# Patient Record
Sex: Male | Born: 1970 | Race: White | Hispanic: No | Smoking: Current every day smoker
Health system: Southern US, Community
[De-identification: ages and names within clinical notes are randomized; demographics above are authoritative.]

## PROBLEM LIST (undated history)

## (undated) DIAGNOSIS — M1990 Unspecified osteoarthritis, unspecified site: Secondary | ICD-10-CM

## (undated) HISTORY — PX: TONSILLECTOMY: SUR1361

## (undated) HISTORY — PX: NECK SURGERY: SHX720

---

## 2004-02-07 ENCOUNTER — Emergency Department (HOSPITAL_COMMUNITY): Admission: EM | Admit: 2004-02-07 | Discharge: 2004-02-07 | Payer: Self-pay | Admitting: Emergency Medicine

## 2004-07-20 ENCOUNTER — Ambulatory Visit: Payer: Self-pay | Admitting: Family Medicine

## 2004-08-06 ENCOUNTER — Ambulatory Visit: Payer: Self-pay | Admitting: Family Medicine

## 2004-11-11 ENCOUNTER — Ambulatory Visit: Payer: Self-pay | Admitting: Family Medicine

## 2005-02-22 ENCOUNTER — Ambulatory Visit: Payer: Self-pay | Admitting: Family Medicine

## 2013-03-07 ENCOUNTER — Encounter (HOSPITAL_COMMUNITY): Payer: Self-pay | Admitting: Emergency Medicine

## 2013-03-07 ENCOUNTER — Emergency Department (HOSPITAL_COMMUNITY)
Admission: EM | Admit: 2013-03-07 | Discharge: 2013-03-07 | Disposition: A | Discharge status: Home / Self Care | Payer: No Typology Code available for payment source | Attending: Emergency Medicine | Admitting: Emergency Medicine

## 2013-03-07 DIAGNOSIS — R202 Paresthesia of skin: Secondary | ICD-10-CM

## 2013-03-07 DIAGNOSIS — F17200 Nicotine dependence, unspecified, uncomplicated: Secondary | ICD-10-CM | POA: Insufficient documentation

## 2013-03-07 DIAGNOSIS — Z79899 Other long term (current) drug therapy: Secondary | ICD-10-CM | POA: Insufficient documentation

## 2013-03-07 DIAGNOSIS — IMO0002 Reserved for concepts with insufficient information to code with codable children: Secondary | ICD-10-CM | POA: Insufficient documentation

## 2013-03-07 DIAGNOSIS — R5381 Other malaise: Secondary | ICD-10-CM | POA: Insufficient documentation

## 2013-03-07 DIAGNOSIS — M79602 Pain in left arm: Secondary | ICD-10-CM

## 2013-03-07 DIAGNOSIS — M79609 Pain in unspecified limb: Secondary | ICD-10-CM | POA: Insufficient documentation

## 2013-03-07 DIAGNOSIS — R209 Unspecified disturbances of skin sensation: Secondary | ICD-10-CM | POA: Insufficient documentation

## 2013-03-07 LAB — POCT I-STAT TROPONIN I: Troponin i, poc: 0 ng/mL (ref 0.00–0.08)

## 2013-03-07 MED ORDER — CYCLOBENZAPRINE HCL 10 MG PO TABS
5.0000 mg | ORAL_TABLET | Freq: Once | ORAL | Status: DC
  Filled 2013-03-07: qty 1

## 2013-03-07 MED ORDER — PREDNISONE 20 MG PO TABS: ORAL_TABLET | ORAL | Status: DC

## 2013-03-07 MED ORDER — METHOCARBAMOL 500 MG PO TABS: 500.0000 mg | ORAL_TABLET | Freq: Two times a day (BID) | ORAL | Status: DC | PRN

## 2013-03-07 MED ORDER — MORPHINE SULFATE 4 MG/ML IJ SOLN
6.0000 mg | Freq: Once | INTRAMUSCULAR | Status: AC
  Administered 2013-03-07: 6 mg via INTRAMUSCULAR
  Filled 2013-03-07: qty 2

## 2013-03-07 MED ORDER — CYCLOBENZAPRINE HCL 10 MG PO TABS: 10.0000 mg | ORAL_TABLET | Freq: Two times a day (BID) | ORAL | Status: DC | PRN
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
## 2013-03-07 NOTE — ED Notes (Signed)
Presents with left arm numbness from the shoulder down into the armpit and down to the first three fingers, began Monday, denies injury. Numbness is associated with pain described as "it feels like a turniquet is wrapped around it" assocaited with feeling tired and "stuffy headed" Speech clear, no droop or arm drift, dneis dizziness. Pt alert, oreinted,. denies chest pain and SOB.

## 2013-03-07 NOTE — ED Provider Notes (Signed)
CSN: [id number]     Arrival date & time 03/07/13  1531 History   First MD Initiated Contact with Patient 03/07/13 1637     Chief Complaint  Patient presents with  . Arm Pain   (Consider location/radiation/quality/duration/timing/severity/associated sxs/prior Treatment) HPI Comments: Pt is a 42 y.o. male with Pmhx as above who presents with 3 days of L arm pain from shoulder to L hand and paresthesias of pointer, middle, ring finger.  He also reports weakness of hand.  No other neuro complains, no injuries, no CP, SOB, fever.  Patient is a 42 y.o. male presenting with extremity pain. The history is provided by the patient and the spouse.  Extremity Pain This is a new problem. The current episode started more than 2 days ago. The problem occurs constantly. The problem has been gradually worsening. Pertinent negatives include no chest pain, no abdominal pain, no headaches and no shortness of breath. Exacerbated by: movement of extermity. The symptoms are relieved by rest. Treatments tried: norc. The treatment provided moderate relief.    History reviewed. No pertinent past medical history. History reviewed. No pertinent past surgical history. History reviewed. No pertinent family history. History  Substance Use Topics  . Smoking status: Current Every Day Smoker    Types: Cigarettes  . Smokeless tobacco: Not on file  . Alcohol Use: No    Review of Systems  Constitutional: Negative for fever, activity change, appetite change and fatigue.  HENT: Negative for congestion, facial swelling, rhinorrhea and trouble swallowing.   Eyes: Negative for photophobia and pain.  Respiratory: Negative for cough, chest tightness and shortness of breath.   Cardiovascular: Negative for chest pain and leg swelling.  Gastrointestinal: Negative for nausea, vomiting, abdominal pain, diarrhea and constipation.  Endocrine: Negative for polydipsia and polyuria.  Genitourinary: Negative for dysuria, urgency,  decreased urine volume and difficulty urinating.  Musculoskeletal: Negative for back pain and gait problem.  Skin: Negative for color change, rash and wound.  Allergic/Immunologic: Negative for immunocompromised state.  Neurological: Positive for weakness and numbness. Negative for dizziness, facial asymmetry, speech difficulty and headaches.  Psychiatric/Behavioral: Negative for confusion, decreased concentration and agitation.    Allergies  Review of patient's allergies indicates no known allergies.  Home Medications   Current Outpatient Rx  Name  Route  Sig  Dispense  Refill  . docusate sodium (COLACE) 100 MG capsule   Oral   Take 100 mg by mouth every morning.         Marland Kitchen FLUoxetine (PROZAC) 10 MG capsule   Oral   Take 10 mg by mouth daily.         Marland Kitchen HYDROcodone-acetaminophen (NORCO) 10-325 MG per tablet   Oral   Take 1-2 tablets by mouth every 6 (six) hours as needed for moderate pain.         Marland Kitchen loratadine (CLARITIN) 10 MG tablet   Oral   Take 10 mg by mouth daily.         . Multiple Vitamin (MULTIVITAMIN WITH MINERALS) TABS tablet   Oral   Take 1 tablet by mouth daily.         . cyclobenzaprine (FLEXERIL) 10 MG tablet   Oral   Take 1 tablet (10 mg total) by mouth 2 (two) times daily as needed for muscle spasms.   20 tablet   0   . predniSONE (DELTASONE) 20 MG tablet      3 tabs po day one, then 2 tabs daily x 4 days  11 tablet   0    BP 119/66  Pulse 86  Temp(Src) 97.8 F (36.6 C) (Oral)  Resp 16  SpO2 96% Physical Exam  Constitutional: He is oriented to person, place, and time. He appears well-developed and well-nourished. No distress.  HENT:  Head: Normocephalic and atraumatic.  Mouth/Throat: No oropharyngeal exudate.  Eyes: Pupils are equal, round, and reactive to light.  Neck: Normal range of motion. Neck supple.  Cardiovascular: Normal rate, regular rhythm and normal heart sounds.  Exam reveals no gallop and no friction rub.   No  murmur heard. Pulmonary/Chest: Effort normal and breath sounds normal. No respiratory distress. He has no wheezes. He has no rales.  Abdominal: Soft. Bowel sounds are normal. He exhibits no distension and no mass. There is no tenderness. There is no rebound and no guarding.  Musculoskeletal: Normal range of motion. He exhibits no edema.       Left shoulder: He exhibits normal range of motion and no tenderness.       Left elbow: Tenderness found.       Arms: Muscular tenderness of L shoulder/arm  Neurological: He is alert and oriented to person, place, and time. He has normal strength. He displays no tremor. A sensory deficit is present. No cranial nerve deficit. He exhibits normal muscle tone. Coordination normal. GCS eye subscore is 4. GCS verbal subscore is 5. GCS motor subscore is 6.  Dec fine touch of pointer, middle, ring finger L hand  Skin: Skin is warm and dry.  Psychiatric: He has a normal mood and affect.    ED Course  Procedures (including critical care time) Labs Review Labs Reviewed  POCT I-STAT TROPONIN I   Imaging Review No results found.  EKG Interpretation    Date/Time:  Wednesday March 07 2013 15:39:52 EST Ventricular Rate:  77 PR Interval:  142 QRS Duration: 100 QT Interval:  384 QTC Calculation: 434 R Axis:   64 Text Interpretation:  Normal sinus rhythm Normal ECG No prior available Confirmed by Mishael Haran  MD, Reneta Niehaus ([id number]) on 03/07/2013 5:32:57 PM            MDM   1. Left arm pain   2. Paresthesias in left hand    Pt is a 42 y.o. male with Pmhx as above who presents with 3 days of L arm pain from shoulder to L hand and paresthesias of pointer, middle, ring finger.  He also reports weakness of hand.  No other neuro complains, no injuries, no CP, SOB, fever.  On PE, VSS, pt in NAD.  No discernable weakness on neuro exam.  +ttp of musculature of L arm, particularly of trapezius, deltoid & bicep.  Good distal pulses/perfusion.  Dec subjective fine  touch of above fingers.  No midline neck pain.  Doubt central process such as CVA/TIA, and doubt acute central cord compression.  I feel a cervical radiculopathy most likely cause of symptoms. Pt has good outpt f/u and will speak w/ his PCP tomorrow to consider outpt MRI.  He can continue home narcotics, will add 5 day course of steroids and muscle relaxer.  Return precautions given for new or worsening symptoms including worsening pain, weakness, other focal neuro symptoms.            Shanna Cisco, MD 03/08/13 1154

## 2013-10-30 ENCOUNTER — Encounter (HOSPITAL_COMMUNITY): Payer: Self-pay | Admitting: Emergency Medicine

## 2013-10-30 ENCOUNTER — Emergency Department (HOSPITAL_COMMUNITY)
Admission: EM | Admit: 2013-10-30 | Discharge: 2013-10-30 | Disposition: A | Discharge status: Home / Self Care | Payer: Commercial Indemnity | Attending: Emergency Medicine | Admitting: Emergency Medicine

## 2013-10-30 DIAGNOSIS — M542 Cervicalgia: Secondary | ICD-10-CM | POA: Insufficient documentation

## 2013-10-30 DIAGNOSIS — F17200 Nicotine dependence, unspecified, uncomplicated: Secondary | ICD-10-CM | POA: Insufficient documentation

## 2013-10-30 DIAGNOSIS — G8911 Acute pain due to trauma: Secondary | ICD-10-CM | POA: Insufficient documentation

## 2013-10-30 DIAGNOSIS — IMO0002 Reserved for concepts with insufficient information to code with codable children: Secondary | ICD-10-CM | POA: Insufficient documentation

## 2013-10-30 DIAGNOSIS — G8929 Other chronic pain: Secondary | ICD-10-CM

## 2013-10-30 DIAGNOSIS — Z87828 Personal history of other (healed) physical injury and trauma: Secondary | ICD-10-CM | POA: Insufficient documentation

## 2013-10-30 DIAGNOSIS — Z8739 Personal history of other diseases of the musculoskeletal system and connective tissue: Secondary | ICD-10-CM | POA: Insufficient documentation

## 2013-10-30 DIAGNOSIS — M25569 Pain in unspecified knee: Secondary | ICD-10-CM | POA: Insufficient documentation

## 2013-10-30 DIAGNOSIS — Z79899 Other long term (current) drug therapy: Secondary | ICD-10-CM | POA: Insufficient documentation

## 2013-10-30 HISTORY — DX: Unspecified osteoarthritis, unspecified site: M19.90

## 2013-10-30 MED ORDER — HYDROMORPHONE HCL PF 1 MG/ML IJ SOLN
1.0000 mg | Freq: Once | INTRAMUSCULAR | Status: AC
  Administered 2013-10-30: 1 mg via INTRAMUSCULAR
  Filled 2013-10-30: qty 1
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
## 2013-10-31 NOTE — ED Provider Notes (Signed)
Medical screening examination/treatment/procedure(s) were performed by non-physician practitioner and as supervising physician I was immediately available for consultation/collaboration.   EKG Interpretation None       David HornJohn M Jovontae Banko, MD 10/31/13 0110

## 2016-10-16 ENCOUNTER — Emergency Department (HOSPITAL_COMMUNITY): Payer: No Typology Code available for payment source

## 2016-10-16 ENCOUNTER — Encounter (HOSPITAL_COMMUNITY): Payer: Self-pay | Admitting: *Deleted

## 2016-10-16 ENCOUNTER — Emergency Department (HOSPITAL_COMMUNITY)
Admission: EM | Admit: 2016-10-16 | Discharge: 2016-10-16 | Disposition: A | Discharge status: Home / Self Care | Payer: No Typology Code available for payment source | Attending: Emergency Medicine | Admitting: Emergency Medicine

## 2016-10-16 DIAGNOSIS — R0789 Other chest pain: Secondary | ICD-10-CM | POA: Insufficient documentation

## 2016-10-16 DIAGNOSIS — F17210 Nicotine dependence, cigarettes, uncomplicated: Secondary | ICD-10-CM | POA: Insufficient documentation

## 2016-10-16 LAB — CBC
HCT: 43.9 % (ref 39.0–52.0)
Hemoglobin: 14.7 g/dL (ref 13.0–17.0)
MCH: 28.6 pg (ref 26.0–34.0)
MCHC: 33.5 g/dL (ref 30.0–36.0)
MCV: 85.4 fL (ref 78.0–100.0)
Platelets: 237 10*3/uL (ref 150–400)
RBC: 5.14 MIL/uL (ref 4.22–5.81)
RDW: 13.3 % (ref 11.5–15.5)
WBC: 7.1 10*3/uL (ref 4.0–10.5)

## 2016-10-16 LAB — BASIC METABOLIC PANEL
Anion gap: 9 (ref 5–15)
BUN: <5 mg/dL — ABNORMAL LOW (ref 6–20)
CO2: 26 mmol/L (ref 22–32)
Calcium: 8.8 mg/dL — ABNORMAL LOW (ref 8.9–10.3)
Chloride: 105 mmol/L (ref 101–111)
Creatinine, Ser: 0.88 mg/dL (ref 0.61–1.24)
GFR calc Af Amer: >60 mL/min (ref >60)
GFR calc non Af Amer: >60 mL/min (ref >60)
Glucose, Bld: 115 mg/dL — ABNORMAL HIGH (ref 65–99)
Potassium: 3.4 mmol/L — ABNORMAL LOW (ref 3.5–5.1)
Sodium: 140 mmol/L (ref 135–145)

## 2016-10-16 LAB — D-DIMER, QUANTITATIVE: D-Dimer, Quant: 0.3 ug/mL-FEU (ref 0.00–0.50)

## 2016-10-16 MED ORDER — ONDANSETRON 4 MG PO TBDP
4.0000 mg | ORAL_TABLET | Freq: Once | ORAL | Status: AC
  Administered 2016-10-16: 4 mg via ORAL
  Filled 2016-10-16: qty 1

## 2016-10-16 MED ORDER — MORPHINE SULFATE (PF) 4 MG/ML IV SOLN
4.0000 mg | Freq: Once | INTRAVENOUS | Status: AC
  Administered 2016-10-16: 4 mg via INTRAVENOUS
  Filled 2016-10-16: qty 1

## 2016-10-16 MED ORDER — METHOCARBAMOL 500 MG PO TABS: 500.0000 mg | ORAL_TABLET | Freq: Two times a day (BID) | ORAL | 0 refills | Status: AC

## 2016-10-16 MED ORDER — NAPROXEN 500 MG PO TABS: 500.0000 mg | ORAL_TABLET | Freq: Two times a day (BID) | ORAL | 0 refills | Status: DC

## 2016-10-16 MED ORDER — BENZONATATE 100 MG PO CAPS: 100.0000 mg | ORAL_CAPSULE | Freq: Three times a day (TID) | ORAL | 0 refills | Status: AC

## 2016-10-16 NOTE — ED Triage Notes (Signed)
The pt is c/o lt lower rib pain for 3 weeks with a non-productive cough  The pain is worse with coughing and movement  He smokes every day  No temp

## 2016-10-16 NOTE — ED Provider Notes (Signed)
[facility]-EMERGENCY DEPT Provider Note   CSN: [id number] Arrival date & time: 10/16/16  1546     History   Chief Complaint Chief Complaint  Patient presents with  . Rib Injury    HPI David Howe is a 46 y.o. male.  HPI  Pt preseneting with c/o pain in left sided of chest/ribs.  He states he has had some cough for the past couple of weeks, but yesterday developed a severe pain in left ribs.  Deep breathing makes pain worse.  No difficulty breathing.  No fever/chills.  He has not had productive cough.  No anterior chest pain or pressure.  No leg swelling, no hx of DVT/PE, no recent travel/trauma/surgery.  He has not had any treatment prior to arrival.  No hx of fall or injury.  There are no other associated systemic symptoms, there are no other alleviating or modifying factors.   Past Medical History:  Diagnosis Date  . Arthritis     There are no active problems to display for this patient.   Past Surgical History:  Procedure Laterality Date  . TONSILLECTOMY         Home Medications    Prior to Admission medications   Medication Sig Start Date End Date Taking? Authorizing Provider  benzonatate (TESSALON) 100 MG capsule Take 1 capsule (100 mg total) by mouth every 8 (eight) hours. 10/16/16   Jerelyn Scott, MD  cetirizine (ZYRTEC) 10 MG tablet Take 10 mg by mouth daily.    [provider]  diazepam (VALIUM) 10 MG tablet Take 10 mg by mouth 2 (two) times daily.    [provider]  FLUoxetine (PROZAC) 10 MG capsule Take 10 mg by mouth daily.    [provider]  methocarbamol (ROBAXIN) 500 MG tablet Take 1 tablet (500 mg total) by mouth 2 (two) times daily. 10/16/16   Jerelyn Scott, MD  Multiple Vitamin (MULTIVITAMIN WITH MINERALS) TABS tablet Take 1 tablet by mouth daily.    [provider]  naproxen (NAPROSYN) 500 MG tablet Take 1 tablet (500 mg total) by mouth 2 (two) times daily. 10/16/16   Jerelyn Scott, MD  oxyCODONE-acetaminophen  (PERCOCET) 10-325 MG per tablet Take 1 tablet by mouth every 6 (six) hours as needed for pain.    [provider]  predniSONE (DELTASONE) 20 MG tablet Take 40 mg by mouth daily with breakfast. 5 day course started 10/27/13    [provider]  senna-docusate (SENOKOT-S) 8.6-50 MG per tablet Take 1 tablet by mouth daily.    [provider]  tamsulosin (FLOMAX) 0.4 MG CAPS capsule Take 0.4 mg by mouth at bedtime.    [provider]    Family History No family history on file.  Social History Social History  Substance Use Topics  . Smoking status: Current Every Day Smoker    Types: Cigarettes  . Smokeless tobacco: Never Used  . Alcohol use No     Allergies   Patient has no known allergies.   Review of Systems Review of Systems  ROS reviewed and all otherwise negative except for mentioned in HPI   Physical Exam Updated Vital Signs BP 129/82   Pulse (!) 59   Temp 98.4 F (36.9 C)   Resp 16   Ht 5\' 9"  (1.753 m)   Wt 83.3 kg (183 lb 9 oz)   SpO2 97%   BMI 27.11 kg/m  Vitals reviewed Physical Exam Physical Examination: General appearance - alert, well appearing, and in no  distress Mental status - alert, oriented to person, place, and time Eyes - no conjunctival injection, no scleral icterus Mouth - mucous membranes moist, pharynx normal without lesions Neck - supple, no significant adenopathy Chest - clear to auscultation, no wheezes, rales or rhonchi, symmetric air entry, ttp over left ribs at midaxiilary line, no crepitus or stepoffs Heart - normal rate, regular rhythm, normal S1, S2, no murmurs, rubs, clicks or gallops Abdomen - soft, nontender, nondistended, no masses or organomegaly Neurological - alert, oriented, normal speech Extremities - peripheral pulses normal, no pedal edema, no clubbing or cyanosis Skin - normal coloration and turgor, no rashes  ED Treatments / Results  Labs (all labs ordered are listed, but only abnormal  results are displayed) Labs Reviewed  BASIC METABOLIC PANEL - Abnormal; Notable for the following:       Result Value   Potassium 3.4 (*)    Glucose, Bld 115 (*)    BUN <5 (*)    Calcium 8.8 (*)    All other components within normal limits  CBC  D-DIMER, QUANTITATIVE (NOT AT [facility])    EKG  EKG Interpretation None       Radiology Dg Chest 1 View  Result Date: 10/16/2016 CLINICAL DATA:  Question nipple shadow on earlier exam EXAM: CHEST 1 VIEW COMPARISON:  10/16/2016 FINDINGS: Nipple markers placed. LEFT nipple marker corresponds in position with the nodular density seen on the previous exam. No pulmonary nodule identified. Minimal RIGHT basilar atelectasis. IMPRESSION: LEFT nipple shadow on previous exam; no pulmonary nodule identified. Minimal RIGHT basilar atelectasis. Electronically Signed   By: Ulyses SouthwardMark  Boles M.D.   On: 10/16/2016 17:24   Dg Chest 2 View  Result Date: 10/16/2016 CLINICAL DATA:  LEFT side chest pain, shortness of breath and cough since yesterday, sharp pain with inhalation, LEFT lower rib pain for 3 weeks, no known injury EXAM: CHEST  2 VIEW COMPARISON:  None FINDINGS: Upper normal heart size. Mediastinal contours and pulmonary vascularity normal. Minimal RIGHT basilar atelectasis. Lungs otherwise clear. No pleural effusion or pneumothorax. Question LEFT nipple shadow. Degenerative changes thoracic spine. Prior cervicothoracic fusion. IMPRESSION: Question LEFT nipple shadow ; repeat PA chest radiograph with nipple markers recommended to exclude pulmonary nodule. Minimal RIGHT basilar atelectasis. Electronically Signed   By: Ulyses SouthwardMark  Boles M.D.   On: 10/16/2016 16:47    Procedures Procedures (including critical care time)  Medications Ordered in ED Medications  morphine 4 MG/ML injection 4 mg (4 mg Intravenous Given 10/16/16 1733)  ondansetron (ZOFRAN-ODT) disintegrating tablet 4 mg (4 mg Oral Given 10/16/16 1731)     Initial Impression / Assessment and Plan / ED  Course  I have reviewed the triage vital signs and the nursing notes.  Pertinent labs & imaging results that were available during my care of the patient were reviewed by me and considered in my medical decision making (see chart for details).     Pt presenting with c/o left side rib/chest wall pain.  Labs including d-dimer are reassuring-  No signs of pneumonia, low risk for PE.  Symptoms are most likely due to strained muscle from coughing.  Pt given rx for tessalon perles, muscle relaxer, NSAID.  Discharged with strict return precautions.  Pt agreeable with plan.  Final Clinical Impressions(s) / ED Diagnoses   Final diagnoses:  Left-sided chest wall pain    New Prescriptions Discharge Medication List as of 10/16/2016  6:25 PM    START taking these medications   Details  benzonatate (TESSALON) 100  MG capsule Take 1 capsule (100 mg total) by mouth every 8 (eight) hours., Starting Sat 10/16/2016, Print    methocarbamol (ROBAXIN) 500 MG tablet Take 1 tablet (500 mg total) by mouth 2 (two) times daily., Starting Sat 10/16/2016, Print    naproxen (NAPROSYN) 500 MG tablet Take 1 tablet (500 mg total) by mouth 2 (two) times daily., Starting Sat 10/16/2016, Print         Jerelyn Scott, MD 10/17/16 [phone]

## 2016-10-16 NOTE — ED Notes (Signed)
Pt ambulated to bathroom without difficulty.

## 2016-10-16 NOTE — Discharge Instructions (Signed)
Return to the ED with any concerns including difficulty breathing, fever/chills, worsening pain, decreased level of alertness/lethargy, or any other alarming symptoms

## 2016-10-16 NOTE — ED Notes (Signed)
Pt in XR. XR to bring pt to E44

## 2018-07-05 ENCOUNTER — Observation Stay (HOSPITAL_COMMUNITY)
Admission: EM | Admit: 2018-07-05 | Discharge: 2018-07-06 | Disposition: A | Discharge status: Home / Self Care | Payer: No Typology Code available for payment source | Attending: Family Medicine | Admitting: Family Medicine

## 2018-07-05 ENCOUNTER — Emergency Department (HOSPITAL_COMMUNITY): Payer: No Typology Code available for payment source

## 2018-07-05 ENCOUNTER — Other Ambulatory Visit: Payer: Self-pay

## 2018-07-05 ENCOUNTER — Encounter (HOSPITAL_COMMUNITY): Payer: Self-pay

## 2018-07-05 DIAGNOSIS — F17210 Nicotine dependence, cigarettes, uncomplicated: Secondary | ICD-10-CM | POA: Insufficient documentation

## 2018-07-05 DIAGNOSIS — J302 Other seasonal allergic rhinitis: Secondary | ICD-10-CM | POA: Insufficient documentation

## 2018-07-05 DIAGNOSIS — Z791 Long term (current) use of non-steroidal anti-inflammatories (NSAID): Secondary | ICD-10-CM | POA: Insufficient documentation

## 2018-07-05 DIAGNOSIS — Z79899 Other long term (current) drug therapy: Secondary | ICD-10-CM | POA: Insufficient documentation

## 2018-07-05 DIAGNOSIS — R072 Precordial pain: Principal | ICD-10-CM | POA: Insufficient documentation

## 2018-07-05 DIAGNOSIS — I452 Bifascicular block: Secondary | ICD-10-CM | POA: Insufficient documentation

## 2018-07-05 DIAGNOSIS — R51 Headache: Secondary | ICD-10-CM | POA: Insufficient documentation

## 2018-07-05 DIAGNOSIS — R079 Chest pain, unspecified: Secondary | ICD-10-CM | POA: Diagnosis present

## 2018-07-05 LAB — BASIC METABOLIC PANEL
Anion gap: 14 (ref 5–15)
BUN: 7 mg/dL (ref 6–20)
CO2: 21 mmol/L — ABNORMAL LOW (ref 22–32)
Calcium: 8.6 mg/dL — ABNORMAL LOW (ref 8.9–10.3)
Chloride: 105 mmol/L (ref 98–111)
Creatinine, Ser: 1.03 mg/dL (ref 0.61–1.24)
GFR calc Af Amer: >60 mL/min (ref >60)
GFR calc non Af Amer: >60 mL/min (ref >60)
Glucose, Bld: 127 mg/dL — ABNORMAL HIGH (ref 70–99)
Potassium: 3.6 mmol/L (ref 3.5–5.1)
Sodium: 140 mmol/L (ref 135–145)

## 2018-07-05 LAB — TROPONIN I
Troponin I: <0.03 ng/mL (ref <0.03)
Troponin I: <0.03 ng/mL (ref <0.03)

## 2018-07-05 LAB — CBC
HCT: 45.2 % (ref 39.0–52.0)
Hemoglobin: 14.8 g/dL (ref 13.0–17.0)
MCH: 27.9 pg (ref 26.0–34.0)
MCHC: 32.7 g/dL (ref 30.0–36.0)
MCV: 85.1 fL (ref 80.0–100.0)
Platelets: 275 10*3/uL (ref 150–400)
RBC: 5.31 MIL/uL (ref 4.22–5.81)
RDW: 13.5 % (ref 11.5–15.5)
WBC: 6.7 10*3/uL (ref 4.0–10.5)
nRBC: 0 % (ref 0.0–0.2)

## 2018-07-05 LAB — HEPATIC FUNCTION PANEL
ALT: 16 U/L (ref 0–44)
AST: 23 U/L (ref 15–41)
Albumin: 3.6 g/dL (ref 3.5–5.0)
Alkaline Phosphatase: 63 U/L (ref 38–126)
Bilirubin, Direct: <0.1 mg/dL (ref 0.0–0.2)
Total Bilirubin: 0.4 mg/dL (ref 0.3–1.2)
Total Protein: 6.3 g/dL — ABNORMAL LOW (ref 6.5–8.1)

## 2018-07-05 LAB — D-DIMER, QUANTITATIVE: D-Dimer, Quant: 0.31 ug/mL-FEU (ref 0.00–0.50)

## 2018-07-05 MED ORDER — SODIUM CHLORIDE 0.9% FLUSH: 3.0000 mL | Freq: Once | INTRAVENOUS | Status: DC

## 2018-07-05 MED ORDER — ASPIRIN 81 MG PO CHEW
324.0000 mg | CHEWABLE_TABLET | Freq: Once | ORAL | Status: AC
  Administered 2018-07-05: 324 mg via ORAL
  Filled 2018-07-05: qty 4

## 2018-07-05 NOTE — ED Provider Notes (Signed)
[facility] EMERGENCY DEPARTMENT Provider Note   CSN: [id number] Arrival date & time: 07/05/18  1813    History   Chief Complaint Chief Complaint  Patient presents with  . Chest Pain    HPI David Howe is a 48 y.o. male.     HPI   Presents with chest pain, began yesterday HR was all over, when still 90, when getting up will go up to 120s-130s Lightheaded, near-syncope  Chest pain felt like something sitting on his chest, now is minute, previously was 3/10.  Middle of chest, radiation to back somewhat  Reports associated dyspnea No vomiting/nausea Yesterday had lightheadedness, sweating and heart racing Was having palpitations heart racing but not now  No leg pain or swelling No cough or fever  Exertion makes it worse, did not have it before yesterday  No htn, hlpd, DM Smoking cigarettes, in process of quitting No immediate fam hx of early CAD   Past Medical History:  Diagnosis Date  . Arthritis     Patient Active Problem List   Diagnosis Date Noted  . Chest pain 07/06/2018    Past Surgical History:  Procedure Laterality Date  . NECK SURGERY     fusion  . TONSILLECTOMY          Home Medications    Prior to Admission medications   Medication Sig Start Date End Date Taking? Authorizing Provider  FLUoxetine (PROZAC) 10 MG capsule Take 10 mg by mouth daily.   Yes [provider]  levocetirizine (XYZAL) 5 MG tablet Take 5 mg by mouth every evening.   Yes [provider]  Multiple Vitamin (MULTIVITAMIN WITH MINERALS) TABS tablet Take 1 tablet by mouth daily.   Yes [provider]  naproxen sodium (ALEVE) 220 MG tablet Take 220-440 mg by mouth 2 (two) times daily as needed (for pain).    Yes [provider]  benzonatate (TESSALON) 100 MG capsule Take 1 capsule (100 mg total) by mouth every 8 (eight) hours. Patient not taking: Reported on 07/05/2018 10/16/16   Phillis Haggis, MD  methocarbamol  (ROBAXIN) 500 MG tablet Take 1 tablet (500 mg total) by mouth 2 (two) times daily. Patient not taking: Reported on 07/05/2018 10/16/16   Phillis Haggis, MD  naproxen (NAPROSYN) 500 MG tablet Take 1 tablet (500 mg total) by mouth 2 (two) times daily. Patient not taking: Reported on 07/05/2018 10/16/16   Mabe, Latanya Maudlin, MD    Family History History reviewed. No pertinent family history.  Social History Social History   Tobacco Use  . Smoking status: Current Every Day Smoker    Packs/day: 0.50    Years: 30.00    Pack years: 15.00    Types: Cigarettes  . Smokeless tobacco: Never Used  Substance Use Topics  . Alcohol use: No  . Drug use: No     Allergies   Patient has no known allergies.   Review of Systems Review of Systems  Constitutional: Negative for fever.  HENT: Positive for congestion (thinks allergies).   Eyes: Positive for visual disturbance (when feeling like going to pass out).  Respiratory: Positive for shortness of breath.   Cardiovascular: Positive for chest pain and palpitations. Negative for leg swelling.  Gastrointestinal: Negative for abdominal pain, nausea and vomiting.  Genitourinary: Negative for dysuria.  Skin: Negative for rash.  Neurological: Positive for light-headedness. Negative for headaches.     Physical Exam Updated Vital Signs BP 129/76 (BP Location: Right Arm)  Pulse 92   Temp 97.9 F (36.6 C) (Oral)   Resp 18   Ht  (1.778 m)   Wt 89.2 kg   SpO2 96%   BMI 28.22 kg/m   Physical Exam Vitals signs and nursing note reviewed.  Constitutional:      General: He is not in acute distress.    Appearance: He is well-developed. He is not diaphoretic.  HENT:     Head: Normocephalic and atraumatic.  Eyes:     Conjunctiva/sclera: Conjunctivae normal.  Neck:     Musculoskeletal: Normal range of motion.  Cardiovascular:     Rate and Rhythm: Normal rate and regular rhythm.     Heart sounds: Normal heart sounds. No murmur. No friction rub.  No gallop.   Pulmonary:     Effort: Pulmonary effort is normal. No respiratory distress.     Breath sounds: Normal breath sounds. No wheezing or rales.  Abdominal:     General: There is no distension.     Palpations: Abdomen is soft.     Tenderness: There is no abdominal tenderness. There is no guarding.  Skin:    General: Skin is warm and dry.  Neurological:     Mental Status: He is alert and oriented to person, place, and time.      ED Treatments / Results  Labs (all labs ordered are listed, but only abnormal results are displayed) Labs Reviewed  BASIC METABOLIC PANEL - Abnormal; Notable for the following components:      Result Value   CO2 21 (*)    Glucose, Bld 127 (*)    Calcium 8.6 (*)    All other components within normal limits  HEPATIC FUNCTION PANEL - Abnormal; Notable for the following components:   Total Protein 6.3 (*)    All other components within normal limits  BASIC METABOLIC PANEL - Abnormal; Notable for the following components:   Glucose, Bld 101 (*)    Calcium 8.6 (*)    All other components within normal limits  LIPID PANEL - Abnormal; Notable for the following components:   Cholesterol 255 (*)    Triglycerides 291 (*)    HDL 33 (*)    VLDL 58 (*)    LDL Cholesterol 164 (*)    All other components within normal limits  CBC  TROPONIN I  D-DIMER, QUANTITATIVE (NOT AT [facility])  TROPONIN I  CBC  CREATININE, SERUM  TROPONIN I  TROPONIN I  HIV ANTIBODY (ROUTINE TESTING W REFLEX)  TROPONIN I    EKG EKG Interpretation  Date/Time:  Wednesday July 05 2018 17:20:00 EDT Ventricular Rate:  91 PR Interval:  144 QRS Duration: 140 QT Interval:  394 QTC Calculation: 484 R Axis:   48 Text Interpretation:  Normal sinus rhythm Right bundle branch block Abnormal ECG Confirmed by Pricilla Loveless [phone]) on 07/06/2018 8:28:36 AM   Radiology Dg Chest 2 View  Result Date: 07/05/2018 CLINICAL DATA:  Chest pain EXAM: CHEST - 2 VIEW COMPARISON:  None.  FINDINGS: There is no appreciable edema or consolidation. The heart size and pulmonary vascularity are normal. No adenopathy. There is postoperative change in the lower cervical region. There is degenerative change in the thoracic spine. No pneumothorax. IMPRESSION: No edema or consolidation. Electronically Signed   By: Bretta Bang III M.D.   On: 07/05/2018 19:48    Procedures Procedures (including critical care time)  Medications Ordered in ED Medications  FLUoxetine (PROZAC) capsule 10 mg (has no administration in time range)  enoxaparin (LOVENOX) injection 40 mg (40 mg Subcutaneous Given 07/06/18 0817)  acetaminophen (TYLENOL) tablet 650 mg (has no administration in time range)    Or  acetaminophen (TYLENOL) suppository 650 mg (has no administration in time range)  polyethylene glycol (MIRALAX / GLYCOLAX) packet 17 g (has no administration in time range)  nicotine (NICODERM CQ - dosed in mg/24 hours) patch 14 mg (14 mg Transdermal Patch Applied 07/06/18 0816)  aspirin chewable tablet 324 mg (324 mg Oral Given 07/05/18 2017)     Initial Impression / Assessment and Plan / ED Course  I have reviewed the triage vital signs and the nursing notes.  Pertinent labs & imaging results that were available during my care of the patient were reviewed by me and considered in my medical decision making (see chart for details).        48 year old male with history of smoking presents with concern of chest pain. Differential diagnosis for chest pain includes pulmonary embolus, dissection, pneumothorax, pneumonia, ACS, myocarditis, pericarditis.  EKG was done and evaluate by me and showed no acute ST changes and no signs of pericarditis. Chest x-ray was done and evaluated by me and radiology and showed no sign of pneumonia or pneumothorax. Patient is low risk [place] with a negative DDimer and have low suspicion for PE.  Troponin negative x2.  Patient is HEART score of 4 given historical factors, hx of  smoking.  Will admit for continued evaluation of exertional chest pain.  Final Clinical Impressions(s) / ED Diagnoses   Final diagnoses:  Chest pain, unspecified type    ED Discharge Orders         Ordered    Increase activity slowly     07/06/18 0924    Diet - low sodium heart healthy     07/06/18 0924           Alvira MondaySchlossman, Keats Kingry, MD 07/06/18 1013
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
## 2018-07-05 NOTE — ED Triage Notes (Signed)
Pt arrives pov from PCP where pt says he went for chest pain patient had an "abnormal EKG" there and sent to ED.

## 2018-07-05 NOTE — ED Notes (Signed)
Patient is resting comfortably. 

## 2018-07-06 ENCOUNTER — Other Ambulatory Visit: Payer: Self-pay

## 2018-07-06 DIAGNOSIS — R51 Headache: Secondary | ICD-10-CM

## 2018-07-06 DIAGNOSIS — H9191 Unspecified hearing loss, right ear: Secondary | ICD-10-CM

## 2018-07-06 DIAGNOSIS — Z720 Tobacco use: Secondary | ICD-10-CM

## 2018-07-06 DIAGNOSIS — R079 Chest pain, unspecified: Secondary | ICD-10-CM | POA: Diagnosis present

## 2018-07-06 LAB — CBC
HCT: 45.1 % (ref 39.0–52.0)
Hemoglobin: 14.8 g/dL (ref 13.0–17.0)
MCH: 27.7 pg (ref 26.0–34.0)
MCHC: 32.8 g/dL (ref 30.0–36.0)
MCV: 84.5 fL (ref 80.0–100.0)
Platelets: 254 10*3/uL (ref 150–400)
RBC: 5.34 MIL/uL (ref 4.22–5.81)
RDW: 13.4 % (ref 11.5–15.5)
WBC: 6.1 10*3/uL (ref 4.0–10.5)
nRBC: 0 % (ref 0.0–0.2)

## 2018-07-06 LAB — BASIC METABOLIC PANEL
Anion gap: 10 (ref 5–15)
BUN: 6 mg/dL (ref 6–20)
CO2: 26 mmol/L (ref 22–32)
Calcium: 8.6 mg/dL — ABNORMAL LOW (ref 8.9–10.3)
Chloride: 104 mmol/L (ref 98–111)
Creatinine, Ser: 0.95 mg/dL (ref 0.61–1.24)
GFR calc Af Amer: >60 mL/min (ref >60)
GFR calc non Af Amer: >60 mL/min (ref >60)
Glucose, Bld: 101 mg/dL — ABNORMAL HIGH (ref 70–99)
Potassium: 3.8 mmol/L (ref 3.5–5.1)
Sodium: 140 mmol/L (ref 135–145)

## 2018-07-06 LAB — CREATININE, SERUM
Creatinine, Ser: 1.08 mg/dL (ref 0.61–1.24)
GFR calc Af Amer: >60 mL/min (ref >60)
GFR calc non Af Amer: >60 mL/min (ref >60)

## 2018-07-06 LAB — LIPID PANEL
Cholesterol: 255 mg/dL — ABNORMAL HIGH (ref 0–200)
HDL: 33 mg/dL — ABNORMAL LOW (ref >40)
LDL Cholesterol: 164 mg/dL — ABNORMAL HIGH (ref 0–99)
Total CHOL/HDL Ratio: 7.7 RATIO
Triglycerides: 291 mg/dL — ABNORMAL HIGH (ref <150)
VLDL: 58 mg/dL — ABNORMAL HIGH (ref 0–40)

## 2018-07-06 LAB — TROPONIN I
Troponin I: <0.03 ng/mL (ref <0.03)
Troponin I: <0.03 ng/mL (ref <0.03)

## 2018-07-06 LAB — HIV ANTIBODY (ROUTINE TESTING W REFLEX): HIV Screen 4th Generation wRfx: NONREACTIVE

## 2018-07-06 MED ORDER — ENOXAPARIN SODIUM 40 MG/0.4ML ~~LOC~~ SOLN
40.0000 mg | Freq: Every day | SUBCUTANEOUS | Status: DC
  Administered 2018-07-06: 40 mg via SUBCUTANEOUS
  Filled 2018-07-06: qty 0.4

## 2018-07-06 MED ORDER — ACETAMINOPHEN 650 MG RE SUPP: 650.0000 mg | Freq: Four times a day (QID) | RECTAL | Status: DC | PRN

## 2018-07-06 MED ORDER — ACETAMINOPHEN 325 MG PO TABS: 650.0000 mg | ORAL_TABLET | Freq: Four times a day (QID) | ORAL | Status: DC | PRN

## 2018-07-06 MED ORDER — NICOTINE 14 MG/24HR TD PT24
14.0000 mg | MEDICATED_PATCH | Freq: Every day | TRANSDERMAL | Status: DC
  Administered 2018-07-06: 14 mg via TRANSDERMAL
  Filled 2018-07-06: qty 1

## 2018-07-06 MED ORDER — FLUOXETINE HCL 10 MG PO CAPS: 10.0000 mg | ORAL_CAPSULE | Freq: Every day | ORAL | Status: DC

## 2018-07-06 MED ORDER — POLYETHYLENE GLYCOL 3350 17 G PO PACK: 17.0000 g | PACK | Freq: Every day | ORAL | Status: DC | PRN

## 2018-07-06 NOTE — Progress Notes (Signed)
Pt has been admitted to 3 east. Pt is alert and oriented. Vitals stable. Pt in no acute distress. Will continue to monitor.

## 2018-07-06 NOTE — Discharge Summary (Signed)
Family Medicine Teaching [facility] Discharge Summary  Patient name: David Howe Medical record number: [id number] Date of birth: 11-23-1970 Age: 48 y.o. Gender: male Date of Admission: 07/05/2018  Date of Discharge: 07/06/2018 Admitting Physician: Nestor Ramp, MD  Primary Care Provider: [facility], [facility]: none  Indication for Hospitalization:  acs rule out  Discharge Diagnoses/Problem List:  Substernal chest pain Headaches Tobacco use  Disposition: home  Discharge Condition: stable  Discharge Exam:  General: well appearing, pleasant 48 year old caucasian male resting comfortably Cards: regular rate and rhythm, no m/r/g Lungs: clear to auscultation bilaterally, no accessory muscle use Gi: soft, non-tender, non-distended Extremity: 5/5 strength BUE BLE  Brief Hospital Course:  48 year old male who presented on 4/8 with one day history of substernal chest pressure. His symptoms started when he was working outside in the heat and were described as lightheadedness and fogginess along with the chest pressure. His initial troponin was negative. He was admitted for ACS rule out. His next two troponins were both negative and he did not have any further chest pain. His ekg did show a new onset LBBB. Repeat on the morning of 4/9 was unchanged. [name] was discharged home, encouraged to follow up with his primary care physician when able. Case discussed with cardiology who did not feel patient warranted cardiology follow up.  Issues for Follow Up:  1. Monitor for any new onset chest pain  Significant Procedures:   Significant Labs and Imaging:  Recent Labs  Lab 07/05/18 1851 07/06/18 0050  WBC 6.7 6.1  HGB 14.8 14.8  HCT 45.2 45.1  PLT 275 254   Recent Labs  Lab 07/05/18 1851 07/06/18 0050 07/06/18 0657  NA 140  --  140  K 3.6  --  3.8  CL 105  --  104  CO2 21*  --  26  GLUCOSE 127*  --  101*  BUN 7  --  6  CREATININE 1.03 1.08 0.95   CALCIUM 8.6*  --  8.6*  ALKPHOS 63  --   --   AST 23  --   --   ALT 16  --   --   ALBUMIN 3.6  --   --       Results/Tests Pending at Time of Discharge:   Discharge Medications:  Allergies as of 07/06/2018   No Known Allergies     Medication List    STOP taking these medications   naproxen 500 MG tablet Commonly known as:  NAPROSYN     TAKE these medications   benzonatate 100 MG capsule Commonly known as:  TESSALON Take 1 capsule (100 mg total) by mouth every 8 (eight) hours.   FLUoxetine 10 MG capsule Commonly known as:  PROZAC Take 10 mg by mouth daily.   levocetirizine 5 MG tablet Commonly known as:  XYZAL Take 5 mg by mouth every evening.   methocarbamol 500 MG tablet Commonly known as:  ROBAXIN Take 1 tablet (500 mg total) by mouth 2 (two) times daily.   multivitamin with minerals Tabs tablet Take 1 tablet by mouth daily.   naproxen sodium 220 MG tablet Commonly known as:  ALEVE Take 220-440 mg by mouth 2 (two) times daily as needed (for pain).       Discharge Instructions: Please refer to Patient Instructions section of EMR for full details.  Patient was counseled important signs and symptoms that should prompt return to medical care, changes in medications, dietary instructions, activity  restrictions, and follow up appointments.   Follow-Up Appointments: Follow-up Information    P[facility], [facility] Go on 07/11/2018.   Specialty:  Family Medicine Why:  @11 :Azzie Roup00am Contact information: [address] [place] [address] [phone]           Myrene BuddyFletcher, Cierria Height, MD 07/06/2018, 11:49 AM PGY-2, [facility]

## 2018-07-06 NOTE — Plan of Care (Signed)
  Problem: Education: Goal: Ability to demonstrate management of disease process will improve 07/06/2018 0947 by Jeanella Flattery, RN Outcome: Adequate for Discharge 07/06/2018 0946 by Jeanella Flattery, RN Outcome: Progressing   Problem: Education: Goal: Ability to verbalize understanding of medication therapies will improve 07/06/2018 0947 by Jeanella Flattery, RN Outcome: Adequate for Discharge 07/06/2018 0946 by Jeanella Flattery, RN Outcome: Progressing

## 2018-07-06 NOTE — Progress Notes (Signed)
Patient ready for discharge. 

## 2018-07-06 NOTE — ED Notes (Signed)
ED TO INPATIENT HANDOFF REPORT  ED Nurse Name and Phone #: Michele Mcalpine [phone]  S Name/Age/Gender David Howe 48 y.o. male Room/Bed: 035C/035C  Code Status   Code Status: Not on file  Home/SNF/Other Home Patient oriented to: self, place, time and situation Is this baseline? Yes   Triage Complete: Triage complete  Chief Complaint chest pain  Triage Note Pt arrives pov from PCP where pt says he went for chest pain patient had an "abnormal EKG" there and sent to ED.    Allergies No Known Allergies  Level of Care/Admitting Diagnosis ED Disposition    ED Disposition Condition Comment   Admit  Hospital Area: [facility] [[id number]]  Level of Care: Telemetry Cardiac [103]  Diagnosis: Chest pain [010272]  Admitting Physician: Allayne Stack [[id number]]  Attending Physician: Denny Levy L [[id number]  PT Class (Do Not Modify): Observation [104]  PT Acc Code (Do Not Modify): Observation [10022]       B Medical/Surgery History Past Medical History:  Diagnosis Date  . Arthritis    Past Surgical History:  Procedure Laterality Date  . NECK SURGERY     fusion  . TONSILLECTOMY       A IV Location/Drains/Wounds Patient Lines/Drains/Airways Status   Active Line/Drains/Airways    Name:   Placement date:   Placement time:   Site:   Days:   Peripheral IV 07/05/18 Right Forearm   07/05/18    1845    Forearm   1          Intake/Output Last 24 hours No intake or output data in the 24 hours ending 07/06/18 0022  Labs/Imaging Results for orders placed or performed during the hospital encounter of 07/05/18 (from the past 48 hour(s))  Basic metabolic panel     Status: Abnormal   Collection Time: 07/05/18  6:51 PM  Result Value Ref Range   Sodium 140 135 - 145 mmol/L   Potassium 3.6 3.5 - 5.1 mmol/L   Chloride 105 98 - 111 mmol/L   CO2 21 (L) 22 - 32 mmol/L   Glucose, Bld 127 (H) 70 - 99 mg/dL   BUN 7 6 - 20 mg/dL   Creatinine, Ser 3.47 0.61 - 1.24 mg/dL   Calcium 8.6 (L) 8.9 - 10.3 mg/dL   GFR calc non Af Amer >60 >60 mL/min   GFR calc Af Amer >60 >60 mL/min   Anion gap 14 5 - 15    Comment: Performed at [facility], [address], [place], [place] [address]  CBC     Status: None   Collection Time: 07/05/18  6:51 PM  Result Value Ref Range   WBC 6.7 4.0 - 10.5 K/uL   RBC 5.31 4.22 - 5.81 MIL/uL   Hemoglobin 14.8 13.0 - 17.0 g/dL   HCT 63.8 75.6 - 43.3 %   MCV 85.1 80.0 - 100.0 fL   MCH 27.9 26.0 - 34.0 pg   MCHC 32.7 30.0 - 36.0 g/dL   RDW 29.5 18.8 - 41.6 %   Platelets 275 150 - 400 K/uL   nRBC 0.0 0.0 - 0.2 %    Comment: Performed at [facility], [address], [place], [place] [address]  Troponin I - ONCE - STAT     Status: None   Collection Time: 07/05/18  6:51 PM  Result Value Ref Range   Troponin I <0.03 <0.03 ng/mL    Comment: Performed at [facility], [address] [place], [place] [address]  D-dimer, quantitative (not at [facility])     Status: None   Collection Time: 07/05/18  6:51 PM  Result Value Ref Range   D-Dimer, Quant 0.31 0.00 - 0.50 ug/mL-FEU    Comment: (NOTE) At the manufacturer cut-off of 0.50 ug/mL FEU, this assay has been documented to exclude PE with a sensitivity and negative predictive value of 97 to 99%.  At this time, this assay has not been approved by the FDA to exclude DVT/VTE. Results should be correlated with clinical presentation. Performed at [facility], [address] [place], [place] [address]   Hepatic function panel     Status: Abnormal   Collection Time: 07/05/18  6:51 PM  Result Value Ref Range   Total Protein 6.3 (L) 6.5 - 8.1 g/dL   Albumin 3.6 3.5 - 5.0 g/dL   AST 23 15 - 41 U/L   ALT 16 0 - 44 U/L   Alkaline Phosphatase 63 38 - 126 U/L   Total Bilirubin 0.4 0.3 - 1.2 mg/dL   Bilirubin, Direct <0.9<0.1 0.0 - 0.2 mg/dL   Indirect Bilirubin NOT CALCULATED 0.3 - 0.9 mg/dL    Comment: Performed at [facility], [address] [place], [place]  [address]  Troponin I - ONCE - STAT     Status: None   Collection Time: 07/05/18  9:40 PM  Result Value Ref Range   Troponin I <0.03 <0.03 ng/mL    Comment: Performed at [facility], [address] [place], [place] [address]   Dg Chest 2 View  Result Date: 07/05/2018 CLINICAL DATA:  Chest pain EXAM: CHEST - 2 VIEW COMPARISON:  None. FINDINGS: There is no appreciable edema or consolidation. The heart size and pulmonary vascularity are normal. No adenopathy. There is postoperative change in the lower cervical region. There is degenerative change in the thoracic spine. No pneumothorax. IMPRESSION: No edema or consolidation. Electronically Signed   By: Bretta BangWilliam  Woodruff III M.D.   On: 07/05/2018 19:48    Pending Labs [employer] Labs (From admission, onward)    Start     Ordered   Signed and Held  CBC  (enoxaparin (LOVENOX)    CrCl >/= 30 ml/min)  Once,   R    Comments:  Baseline for enoxaparin therapy IF NOT ALREADY DRAWN.  Notify MD if PLT < 100 K.    Signed and Held   Signed and Held  Creatinine, serum  (enoxaparin (LOVENOX)    CrCl >/= 30 ml/min)  Once,   R    Comments:  Baseline for enoxaparin therapy IF NOT ALREADY DRAWN.    Signed and Held   Signed and Held  HIV antibody (Routine Testing)  Tomorrow morning,   R     Signed and Held   Signed and Held  Basic metabolic panel  Tomorrow morning,   R     Signed and Held   Signed and Held  Troponin I - Now Then Q6H  Now then every 6 hours,   R     Signed and Held          Vitals/Pain Today's Vitals   07/05/18 2045 07/05/18 2130 07/05/18 2200 07/05/18 2245  BP: 119/73 119/77 100/63 110/72  Pulse: 77 62 62 67  Resp:  16 18 15   Temp:      TempSrc:      SpO2: 98% 94% 94% 93%  Weight:      Height:  PainSc:        Isolation Precautions No active isolations  Medications Medications  sodium chloride flush (NS) 0.9 % injection 3 mL (3 mLs Intravenous Not Given 07/05/18 2005)  aspirin chewable tablet 324 mg (324 mg Oral  Given 07/05/18 2017)    Mobility walks Low fall risk   Focused Assessments   R Recommendations: See Admitting Provider Note  Report given to:   Additional Notes:

## 2018-07-06 NOTE — Plan of Care (Signed)

## 2018-07-10 ENCOUNTER — Other Ambulatory Visit: Payer: Self-pay

## 2018-07-10 ENCOUNTER — Emergency Department (HOSPITAL_COMMUNITY)
Admission: EM | Admit: 2018-07-10 | Discharge: 2018-07-10 | Disposition: A | Discharge status: Home / Self Care | Payer: Self-pay | Attending: Emergency Medicine | Admitting: Emergency Medicine

## 2018-07-10 ENCOUNTER — Emergency Department (HOSPITAL_COMMUNITY): Payer: Self-pay

## 2018-07-10 ENCOUNTER — Encounter (HOSPITAL_COMMUNITY): Payer: Self-pay | Admitting: Emergency Medicine

## 2018-07-10 DIAGNOSIS — E7800 Pure hypercholesterolemia, unspecified: Secondary | ICD-10-CM

## 2018-07-10 DIAGNOSIS — R079 Chest pain, unspecified: Secondary | ICD-10-CM | POA: Insufficient documentation

## 2018-07-10 DIAGNOSIS — R002 Palpitations: Secondary | ICD-10-CM | POA: Insufficient documentation

## 2018-07-10 DIAGNOSIS — F17210 Nicotine dependence, cigarettes, uncomplicated: Secondary | ICD-10-CM | POA: Insufficient documentation

## 2018-07-10 DIAGNOSIS — R0789 Other chest pain: Secondary | ICD-10-CM

## 2018-07-10 DIAGNOSIS — Z79899 Other long term (current) drug therapy: Secondary | ICD-10-CM | POA: Insufficient documentation

## 2018-07-10 LAB — BASIC METABOLIC PANEL
Anion gap: 13 (ref 5–15)
BUN: 7 mg/dL (ref 6–20)
CO2: 23 mmol/L (ref 22–32)
Calcium: 8.8 mg/dL — ABNORMAL LOW (ref 8.9–10.3)
Chloride: 102 mmol/L (ref 98–111)
Creatinine, Ser: 1.16 mg/dL (ref 0.61–1.24)
GFR calc Af Amer: >60 mL/min (ref >60)
GFR calc non Af Amer: >60 mL/min (ref >60)
Glucose, Bld: 147 mg/dL — ABNORMAL HIGH (ref 70–99)
Potassium: 3.6 mmol/L (ref 3.5–5.1)
Sodium: 138 mmol/L (ref 135–145)

## 2018-07-10 LAB — CBC
HCT: 47.7 % (ref 39.0–52.0)
Hemoglobin: 15.4 g/dL (ref 13.0–17.0)
MCH: 27.7 pg (ref 26.0–34.0)
MCHC: 32.3 g/dL (ref 30.0–36.0)
MCV: 85.8 fL (ref 80.0–100.0)
Platelets: 304 10*3/uL (ref 150–400)
RBC: 5.56 MIL/uL (ref 4.22–5.81)
RDW: 13.2 % (ref 11.5–15.5)
WBC: 6.2 10*3/uL (ref 4.0–10.5)
nRBC: 0 % (ref 0.0–0.2)

## 2018-07-10 LAB — TROPONIN I: Troponin I: <0.03 ng/mL (ref <0.03)

## 2018-07-10 MED ORDER — SODIUM CHLORIDE 0.9% FLUSH
3.0000 mL | Freq: Once | INTRAVENOUS | Status: AC
  Administered 2018-07-10: 3 mL via INTRAVENOUS

## 2018-07-10 NOTE — ED Triage Notes (Signed)
Patient here with chest tightness, shortness of breath and tachycardia.  Patient states that he was here for this last week.

## 2018-07-10 NOTE — Discharge Instructions (Signed)
Please read and follow all provided instructions.  Your diagnoses today include:  1. Palpitations   2. Chest tightness   3. High cholesterol     Tests performed today include:  An EKG of your heart - no change from previous EKG  A chest x-ray - no sign of infection or other problem  Cardiac enzymes - a blood test for heart muscle damage, no sign of heart attack or stress on the heart  Blood counts and electrolytes  Cholesterol - checked during previous hospitalization was high  Vital signs. See below for your results today.   Medications prescribed:   None  Take any prescribed medications only as directed.  Follow-up instructions: Please follow-up with your primary care provider in the next week for further evaluation of your symptoms.   Return instructions:  SEEK IMMEDIATE MEDICAL ATTENTION IF:  You have severe chest pain, especially if the pain is crushing or pressure-like and spreads to the arms, back, neck, or jaw, or if you have sweating, nausea (feeling sick to your stomach), or shortness of breath. THIS IS AN EMERGENCY. Don't wait to see if the pain will go away. Get medical help at once. Call 911 or 0 (operator). DO NOT drive yourself to the hospital.   Your chest pain gets worse and does not go away with rest.   You have an attack of chest pain lasting longer than usual, despite rest and treatment with the medications your caregiver has prescribed.   You wake from sleep with chest pain or shortness of breath.  You feel dizzy or faint.  You have chest pain not typical of your usual pain for which you originally saw your caregiver.   You have any other emergent concerns regarding your health.  Additional Information: Chest pain comes from many different causes. Your caregiver has diagnosed you as having chest pain that is not specific for one problem, but does not require admission.  You are at low risk for an acute heart condition or other serious illness.    Your vital signs today were: BP 116/74    Pulse 70    Temp 98.2 F (36.8 C) (Oral)    Resp (!) 21    SpO2 98%  If your blood pressure (BP) was elevated above 135/85 this visit, please have this repeated by your doctor within one month. --------------

## 2018-07-10 NOTE — ED Provider Notes (Signed)
[facility] EMERGENCY DEPARTMENT Provider Note   CSN: [id number] Arrival date & time: 07/10/18  1946    History   Chief Complaint Chief Complaint  Patient presents with   Chest Pain   Tachycardia    HPI David Howe is a 48 y.o. male.     Patient with history of high cholesterol and hypertension presents the emergency department today with continued chest tightness in the mid chest with complaint of tachypalpitations and associated shortness of breath.  Patient began having symptoms about a week ago.  He was seen in the emergency department and admitted overnight.  He had a rule out.  No stress testing performed at that time.  D-dimer at that time was normal.  Cholesterol found to be elevated but patient was not yet started on any medication for this.  He does smoke cigarettes.  Patient is a pipe fitter.  He states that he had continued symptoms today and decided come back to be rechecked.  He denies any syncope but feels lightheaded when he stands up.  He denies any associated nausea, vomiting, diarrhea, or blood in the stool. Patient denies risk factors for pulmonary embolism including: unilateral leg swelling, history of DVT/PE/other blood clots, use of exogenous hormones, recent immobilizations, recent surgery, recent travel (>4hr segment), malignancy, hemoptysis.  Patient does not have significant shortness of breath with exertion.  States that he is due to follow-up with his primary care doctor in 2 weeks.      Past Medical History:  Diagnosis Date   Arthritis     Patient Active Problem List   Diagnosis Date Noted   Chest pain 07/06/2018    Past Surgical History:  Procedure Laterality Date   NECK SURGERY     fusion   TONSILLECTOMY          Home Medications    Prior to Admission medications   Medication Sig Start Date End Date Taking? Authorizing Provider  benzonatate (TESSALON) 100 MG capsule Take 1 capsule (100 mg total) by mouth  every 8 (eight) hours. Patient not taking: Reported on 07/05/2018 10/16/16   Phillis Haggis, MD  FLUoxetine (PROZAC) 10 MG capsule Take 10 mg by mouth daily.    [provider]  levocetirizine (XYZAL) 5 MG tablet Take 5 mg by mouth every evening.    [provider]  methocarbamol (ROBAXIN) 500 MG tablet Take 1 tablet (500 mg total) by mouth 2 (two) times daily. Patient not taking: Reported on 07/05/2018 10/16/16   Phillis Haggis, MD  Multiple Vitamin (MULTIVITAMIN WITH MINERALS) TABS tablet Take 1 tablet by mouth daily.    [provider]  naproxen sodium (ALEVE) 220 MG tablet Take 220-440 mg by mouth 2 (two) times daily as needed (for pain).     [provider]    Family History No family history on file.  Social History Social History   Tobacco Use   Smoking status: Current Every Day Smoker    Packs/day: 0.50    Years: 30.00    Pack years: 15.00    Types: Cigarettes   Smokeless tobacco: Never Used  Substance Use Topics   Alcohol use: No   Drug use: No     Allergies   Patient has no known allergies.   Review of Systems Review of Systems  Constitutional: Negative for diaphoresis and fever.  Eyes: Negative for redness.  Respiratory: Positive for chest tightness and shortness of breath. Negative for cough.   Cardiovascular:  Positive for chest pain. Negative for palpitations and leg swelling.  Gastrointestinal: Negative for abdominal pain, blood in stool, nausea and vomiting.  Genitourinary: Negative for dysuria.  Musculoskeletal: Negative for back pain and neck pain.  Skin: Negative for rash.  Neurological: Positive for light-headedness. Negative for syncope.  Psychiatric/Behavioral: The patient is not nervous/anxious.      Physical Exam Updated Vital Signs BP (!) 150/98    Pulse (!) 105    Temp 98.2 F (36.8 C) (Oral)    Resp 18    SpO2 98%   Physical Exam Vitals signs and nursing note reviewed.  Constitutional:      Appearance:  He is well-developed. He is not diaphoretic.  HENT:     Head: Normocephalic and atraumatic.     Mouth/Throat:     Mouth: Mucous membranes are not dry.  Eyes:     General:        Right eye: No discharge.        Left eye: No discharge.     Conjunctiva/sclera: Conjunctivae normal.  Neck:     Musculoskeletal: Normal range of motion and neck supple. No muscular tenderness.     Vascular: Normal carotid pulses. No carotid bruit or JVD.     Trachea: Trachea normal. No tracheal deviation.  Cardiovascular:     Rate and Rhythm: Normal rate and regular rhythm.     Pulses: No decreased pulses.     Heart sounds: Normal heart sounds, S1 normal and S2 normal. Heart sounds not distant. No murmur.  Pulmonary:     Effort: Pulmonary effort is normal. No respiratory distress.     Breath sounds: Normal breath sounds. No wheezing, rhonchi or rales.  Chest:     Chest wall: No tenderness.  Abdominal:     General: Bowel sounds are normal.     Palpations: Abdomen is soft.     Tenderness: There is no abdominal tenderness. There is no guarding or rebound.  Skin:    General: Skin is warm and dry.     Coloration: Skin is not pale.  Neurological:     Mental Status: He is alert.      ED Treatments / Results  Labs (all labs ordered are listed, but only abnormal results are displayed) Labs Reviewed  BASIC METABOLIC PANEL - Abnormal; Notable for the following components:      Result Value   Glucose, Bld 147 (*)    Calcium 8.8 (*)    All other components within normal limits  CBC  TROPONIN I    EKG EKG Interpretation  Date/Time:  Monday July 10 2018 20:19:44 EDT Ventricular Rate:  92 PR Interval:    QRS Duration: 161 QT Interval:  397 QTC Calculation: 492 R Axis:   46 Text Interpretation:  Sinus rhythm Right bundle branch block No significant change since last tracing Abnormal ekg Confirmed by Gerhard Munch [phone]) on 07/10/2018 8:45:04 PM   Radiology Dg Chest 2 View  Result Date:  07/10/2018 CLINICAL DATA:  Chest tightness EXAM: CHEST - 2 VIEW COMPARISON:  None. FINDINGS: The heart size and mediastinal contours are within normal limits. Both lungs are clear. The visualized skeletal structures are unremarkable. IMPRESSION: No active cardiopulmonary disease. Electronically Signed   By: Deatra Robinson M.D.   On: 07/10/2018 21:02    Procedures Procedures (including critical care time)  Medications Ordered in ED Medications  sodium chloride flush (NS) 0.9 % injection 3 mL (3 mLs Intravenous Given 07/10/18 2034)  Initial Impression / Assessment and Plan / ED Course  I have reviewed the triage vital signs and the nursing notes.  Pertinent labs & imaging results that were available during my care of the patient were reviewed by me and considered in my medical decision making (see chart for details).        Patient seen and examined. Work-up initiated.  Reviewed previous hospitalization.  I have low concern for pulmonary embolism given his recent negative d-dimer, however will check orthostatics and have patient ambulate to ensure that he does not become hypoxic.  Will recheck chest x-ray and cardiac enzymes.  Patient does not have any symptoms concerning for infectious etiology today.  Anticipate discharge to home if work-up remains stable.  Vital signs reviewed and are as follows: BP (!) 150/98    Pulse (!) 105    Temp 98.2 F (36.8 C) (Oral)    Resp 18    SpO2 98%   10:08 PM Patient with mild tachycardia with orthostatics.  He has ambulated maintaining a 100% pulse oxygenation.   Patient updated on results.  We discussed elevated cholesterol noted on previous hospitalization and that he will need to have this addressed by his primary care doctor.  Patient provided with his cholesterol values.  Patient is comfortable with discharge to home at this time.  Patient was counseled to return with severe chest pain, especially if the pain is crushing or pressure-like and  spreads to the arms, back, neck, or jaw, or if they have sweating, nausea, or shortness of breath with the pain. They were encouraged to call 911 with these symptoms.   They were also told to return if their chest pain gets worse and does not go away with rest, they have an attack of chest pain lasting longer than usual despite rest and treatment with the medications their caregiver has prescribed, if they wake from sleep with chest pain or shortness of breath, if they feel dizzy or faint, if they have chest pain not typical of their usual pain, or if they have any other emergent concerns regarding their health.  The patient verbalized understanding and agreed.    Final Clinical Impressions(s) / ED Diagnoses   Final diagnoses:  Palpitations  Chest tightness  High cholesterol   Patient with ongoing palpitations and chest tightness.  Recent overnight observation with negative troponins.  Risk factor profile was better defined and plan was for patient to follow-up with PCP.  EKG without signs of arrhythmia and signs of prolonged QT, Brugada syndrome, WPW, hypertrophy.  It is unchanged from previous.  Cardiac enzymes remain negative.  Chest x-ray is clear without interval development of edema or infection.  Patient had negative d-dimer and no objective symptoms of DVT tonight.  He does not have a significant risk factor profile or family history.  He was ambulated in the hallway and did not become hypoxic.  I have very low concern for PE at this point.  Patient will need to follow-up with his doctor for ongoing treatment and evaluation.  He will need better control of his risk factor profile.  Given these, he may benefit from cardiology evaluation if his PCP deems as appropriate.  ED Discharge Orders    None       Renne CriglerGeiple, Demitrios Molyneux, Cordelia Poche-C 07/10/18 2211    Gerhard MunchLockwood, Robert, MD 07/11/18 [phone]

## 2018-07-10 NOTE — ED Notes (Signed)
PT ambulated steady with no assistance. O2 remained 100% throughout ambulation

## 2020-10-31 IMAGING — DX CHEST - 2 VIEW
2 series · 2 of 2 positions shown · non-contrast
Comparison: None.

CLINICAL DATA: Chest pain

EXAM:
CHEST - 2 VIEW

[chest pa]
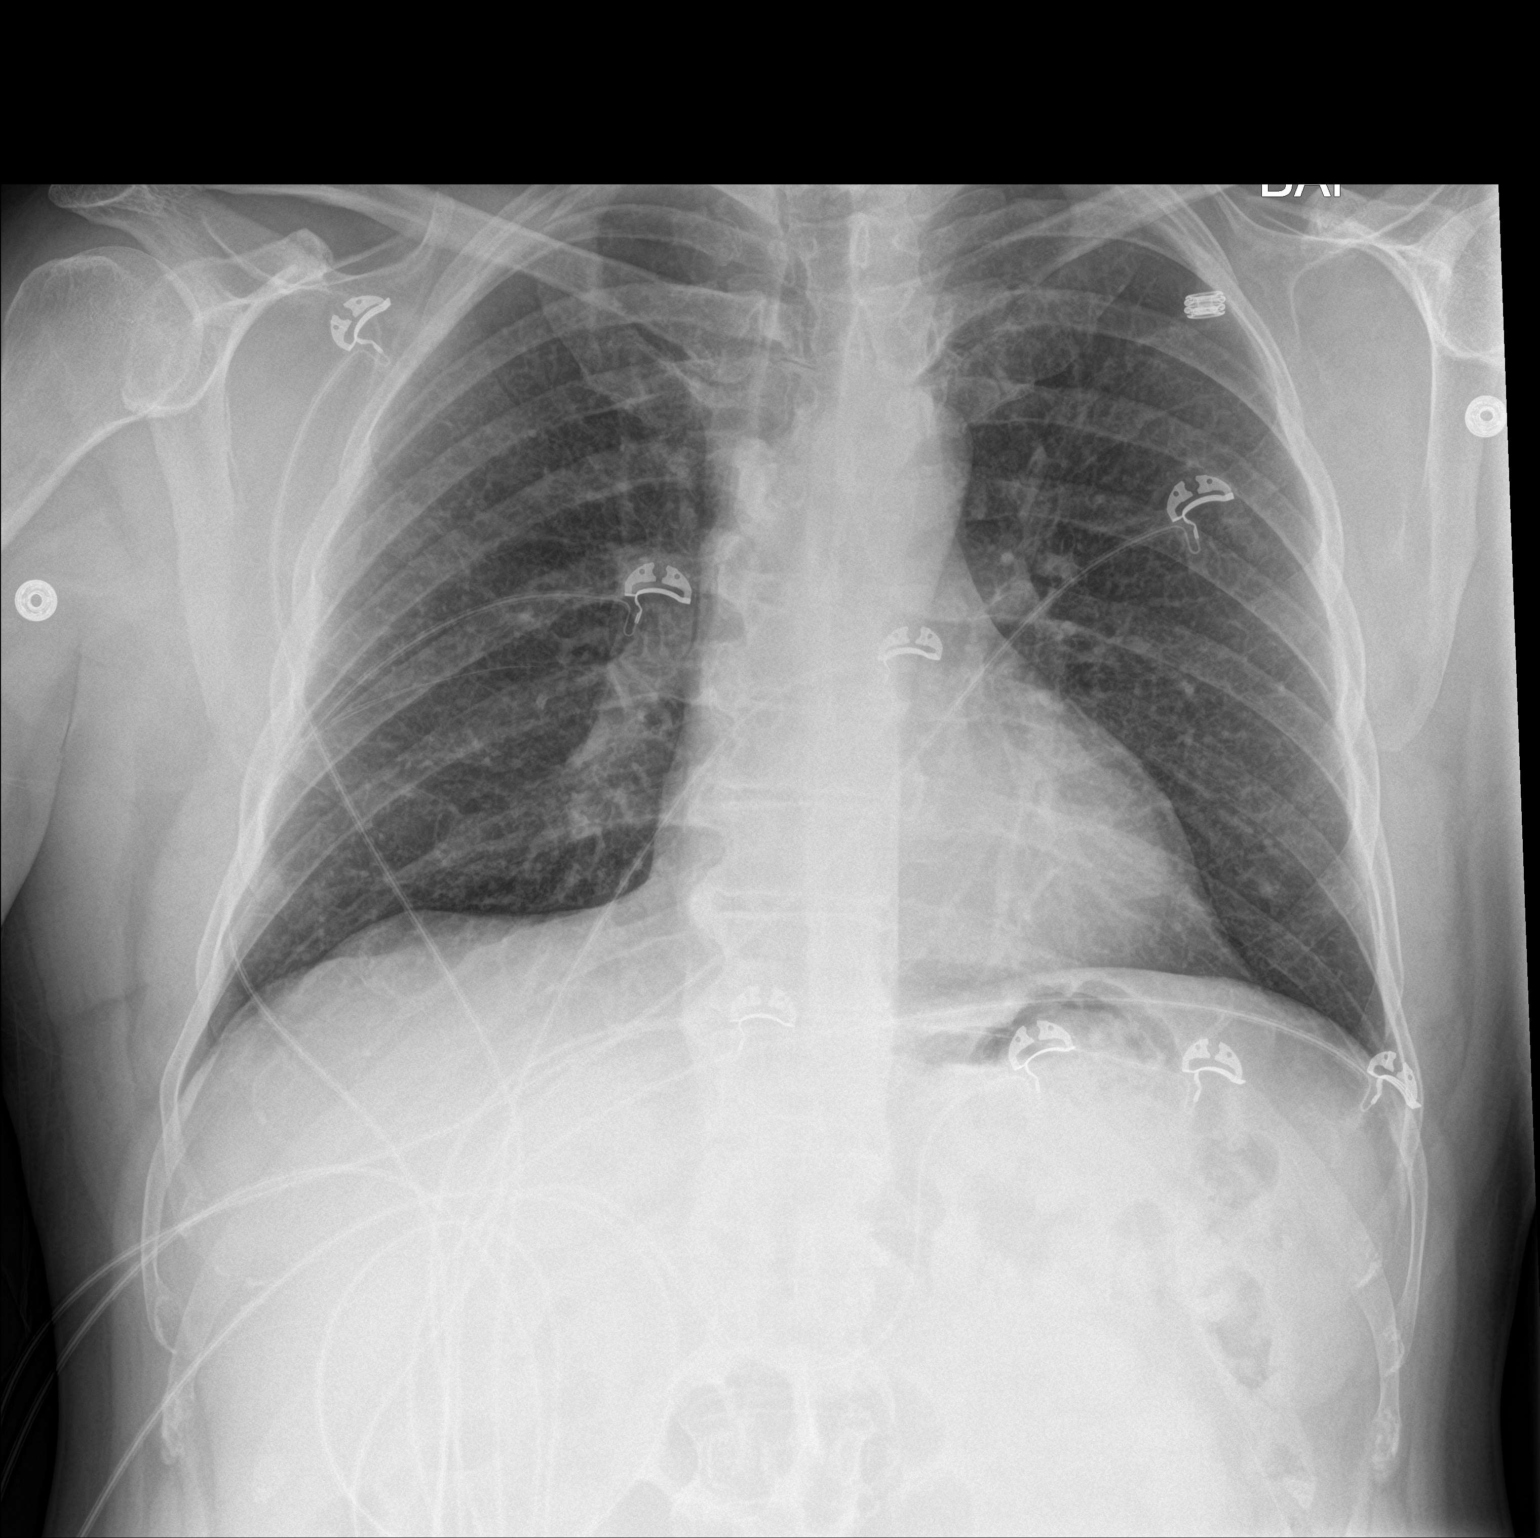

[chest lat]
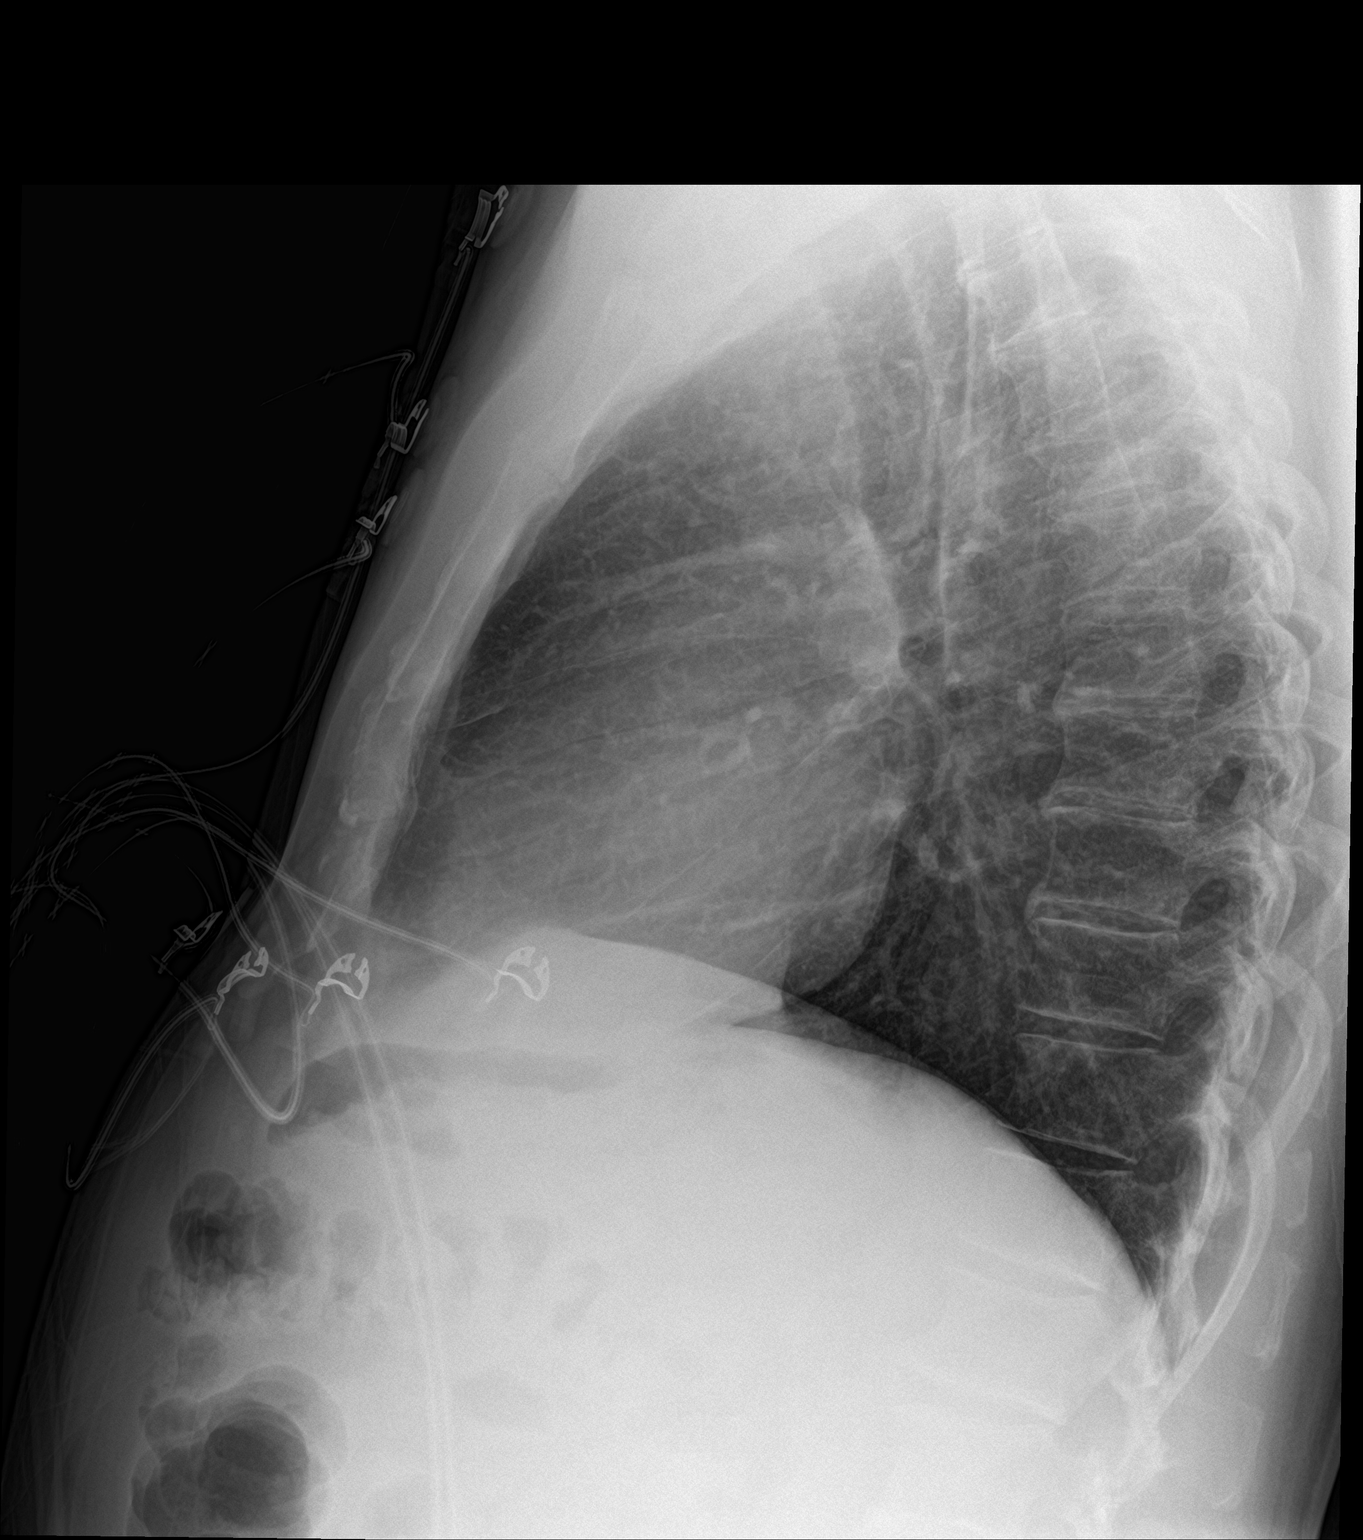

[2 of 2 positions shown; findings below may reference images not displayed]

FINDINGS: There is no appreciable edema or consolidation. The heart size and
pulmonary vascularity are normal. No adenopathy. There is
postoperative change in the lower cervical region. There is
degenerative change in the thoracic spine. No pneumothorax.
IMPRESSION: No edema or consolidation.

## 2022-01-29 ENCOUNTER — Other Ambulatory Visit: Payer: Self-pay | Admitting: Family Medicine

## 2022-01-29 ENCOUNTER — Ambulatory Visit
Admission: RE | Admit: 2022-01-29 | Discharge: 2022-01-29 | Disposition: A | Discharge status: Home / Self Care | Payer: BC Managed Care – PPO | Source: Ambulatory Visit | Attending: Family Medicine | Admitting: Family Medicine

## 2022-01-29 DIAGNOSIS — R071 Chest pain on breathing: Secondary | ICD-10-CM
